# Patient Record
Sex: Male | Born: 1985 | Race: Black or African American | Hispanic: No | Marital: Single | Smoking: Current every day smoker
Health system: Southern US, Community
[De-identification: ages and names within clinical notes are randomized; demographics above are authoritative.]

---

## 2006-07-06 ENCOUNTER — Emergency Department (HOSPITAL_COMMUNITY): Admission: EM | Admit: 2006-07-06 | Discharge: 2006-07-06 | Payer: Self-pay | Admitting: Emergency Medicine

## 2006-07-17 ENCOUNTER — Emergency Department (HOSPITAL_COMMUNITY): Admission: EM | Admit: 2006-07-17 | Discharge: 2006-07-17 | Payer: Self-pay | Admitting: Emergency Medicine

## 2006-11-11 ENCOUNTER — Emergency Department (HOSPITAL_COMMUNITY): Admission: EM | Admit: 2006-11-11 | Discharge: 2006-11-11 | Payer: Self-pay | Admitting: Emergency Medicine

## 2006-12-05 ENCOUNTER — Emergency Department (HOSPITAL_COMMUNITY): Admission: EM | Admit: 2006-12-05 | Discharge: 2006-12-05 | Payer: Self-pay | Admitting: Emergency Medicine

## 2008-07-10 ENCOUNTER — Emergency Department (HOSPITAL_BASED_OUTPATIENT_CLINIC_OR_DEPARTMENT_OTHER): Admission: EM | Admit: 2008-07-10 | Discharge: 2008-07-10 | Payer: Self-pay | Admitting: Emergency Medicine

## 2009-05-27 ENCOUNTER — Ambulatory Visit: Payer: Self-pay | Admitting: Interventional Radiology

## 2009-05-27 ENCOUNTER — Emergency Department (HOSPITAL_BASED_OUTPATIENT_CLINIC_OR_DEPARTMENT_OTHER): Admission: EM | Admit: 2009-05-27 | Discharge: 2009-05-27 | Payer: Self-pay | Admitting: Emergency Medicine

## 2010-08-22 LAB — URINALYSIS, ROUTINE W REFLEX MICROSCOPIC
Bilirubin Urine: NEGATIVE
Glucose, UA: NEGATIVE mg/dL
Ketones, ur: 15 mg/dL — AB
Protein, ur: NEGATIVE mg/dL
Specific Gravity, Urine: 1.031 — ABNORMAL HIGH (ref 1.005–1.030)

## 2010-08-22 LAB — GC/CHLAMYDIA PROBE AMP, GENITAL: GC Probe Amp, Genital: POSITIVE — AB

## 2010-08-22 LAB — URINE MICROSCOPIC-ADD ON

## 2011-02-24 LAB — GC/CHLAMYDIA PROBE AMP, GENITAL: Chlamydia, DNA Probe: NEGATIVE

## 2011-02-25 LAB — I-STAT 8, (EC8 V) (CONVERTED LAB)
Acid-Base Excess: 1
Bicarbonate: 27.9 — ABNORMAL HIGH
Chloride: 107
Glucose, Bld: 83
Hemoglobin: 16.7
TCO2: 29
pCO2, Ven: 51.8 — ABNORMAL HIGH
pH, Ven: 7.339 — ABNORMAL HIGH

## 2011-02-25 LAB — DIFFERENTIAL
Eosinophils Relative: 6 — ABNORMAL HIGH
Monocytes Absolute: 0.5
Neutrophils Relative %: 52

## 2011-02-25 LAB — URINALYSIS, ROUTINE W REFLEX MICROSCOPIC
Bilirubin Urine: NEGATIVE
Glucose, UA: NEGATIVE
Leukocytes, UA: NEGATIVE
Nitrite: NEGATIVE
Protein, ur: NEGATIVE
Urobilinogen, UA: 1

## 2011-02-25 LAB — POCT I-STAT CREATININE: Creatinine, Ser: 1.1

## 2011-02-25 LAB — CBC
HCT: 46.1
Platelets: 267
RDW: 13.3

## 2011-02-25 LAB — URINE MICROSCOPIC-ADD ON

## 2011-05-21 ENCOUNTER — Encounter (HOSPITAL_BASED_OUTPATIENT_CLINIC_OR_DEPARTMENT_OTHER): Payer: Self-pay

## 2011-05-21 ENCOUNTER — Emergency Department (HOSPITAL_BASED_OUTPATIENT_CLINIC_OR_DEPARTMENT_OTHER)
Admission: EM | Admit: 2011-05-21 | Discharge: 2011-05-21 | Disposition: A | Discharge status: Home / Self Care | Payer: Self-pay | Attending: Emergency Medicine | Admitting: Emergency Medicine

## 2011-05-21 ENCOUNTER — Emergency Department (INDEPENDENT_AMBULATORY_CARE_PROVIDER_SITE_OTHER): Payer: Self-pay

## 2011-05-21 DIAGNOSIS — F17200 Nicotine dependence, unspecified, uncomplicated: Secondary | ICD-10-CM | POA: Insufficient documentation

## 2011-05-21 DIAGNOSIS — R05 Cough: Secondary | ICD-10-CM

## 2011-05-21 DIAGNOSIS — R059 Cough, unspecified: Secondary | ICD-10-CM | POA: Insufficient documentation

## 2011-05-21 DIAGNOSIS — M549 Dorsalgia, unspecified: Secondary | ICD-10-CM | POA: Insufficient documentation

## 2011-05-21 DIAGNOSIS — R0989 Other specified symptoms and signs involving the circulatory and respiratory systems: Secondary | ICD-10-CM

## 2011-05-21 MED ORDER — HYDROCOD POLST-CHLORPHEN POLST 10-8 MG/5ML PO LQCR: 5.0000 mL | Freq: Two times a day (BID) | ORAL | Status: DC | PRN

## 2011-05-21 NOTE — ED Provider Notes (Signed)
History     CSN: [id number]  Arrival date & time 05/21/11  1751   First MD Initiated Contact with Patient 05/21/11 1812      Chief Complaint  Patient presents with  . Back Pain  . Cough    (Consider location/radiation/quality/duration/timing/severity/associated sxs/prior treatment) HPI Comments: Pt states that his back hurts with coughing  Patient is a 26 y.o. male presenting with cough. The history is provided by the patient. No language interpreter was used.  Cough This is a new problem. The current episode started yesterday. The problem occurs constantly. The problem has not changed since onset.The cough is productive of sputum. There has been no fever. Associated symptoms include rhinorrhea. Pertinent negatives include no headaches and no shortness of breath. He has tried nothing for the symptoms. He is a smoker.    History reviewed. No pertinent past medical history.  History reviewed. No pertinent past surgical history.  No family history on file.  History  Substance Use Topics  . Smoking status: Current Everyday Smoker -- 0.5 packs/day  . Smokeless tobacco: Not on file  . Alcohol Use: 0.6 oz/week    1 Cans of beer per week     daily      Review of Systems  HENT: Positive for rhinorrhea.   Respiratory: Positive for cough. Negative for shortness of breath.   Neurological: Negative for headaches.  All other systems reviewed and are negative.    Allergies  Review of patient's allergies indicates no known allergies.  Home Medications   Current Outpatient Rx  Name Route Sig Dispense Refill  . DM-DOXYLAMINE-ACETAMINOPHEN 15-6.25-325 MG PO CAPS Oral Take 2 capsules by mouth once.    Marland Kitchen PHENYLEPHRINE-APAP-GUAIFENESIN 10-650-400 MG/20ML PO LIQD Oral Take 45 mLs by mouth once.    Marland Kitchen HYDROCOD POLST-CPM POLST ER 10-8 MG/5ML PO LQCR Oral Take 5 mLs by mouth every 12 (twelve) hours as needed. 140 mL 0    BP 104/55  Pulse 98  Temp(Src) 98.7 F (37.1 C) (Oral)  Resp  16  Ht 5\' 10"  (1.778 m)  Wt 145 lb (65.772 kg)  BMI 20.81 kg/m2  SpO2 100%  Physical Exam  Nursing note and vitals reviewed. Constitutional: He is oriented to person, place, and time. He appears well-developed and well-nourished.  HENT:  Head: Normocephalic and atraumatic.  Right Ear: External ear normal.  Left Ear: External ear normal.  Nose: Rhinorrhea present.  Mouth/Throat: Posterior oropharyngeal erythema present.  Eyes: Conjunctivae and EOM are normal.  Neck: Normal range of motion. Neck supple.  Cardiovascular: Normal rate and regular rhythm.   Pulmonary/Chest: Effort normal and breath sounds normal.  Musculoskeletal: Normal range of motion. He exhibits no tenderness.  Neurological: He is alert and oriented to person, place, and time. He exhibits normal muscle tone. Coordination normal.  Skin: Skin is warm and dry.    ED Course  Procedures (including critical care time)  Labs Reviewed - No data to display Dg Chest 2 View  05/21/2011  *RADIOLOGY REPORT*  Clinical Data: Cough and congestion.  CHEST - 2 VIEW  Comparison: None.  Findings: Lungs are hyperexpanded. The lungs are clear without focal infiltrate, edema, pneumothorax or pleural effusion. The cardiopericardial silhouette is within normal limits for size. Imaged bony structures of the thorax are intact.  IMPRESSION: Hyperexpansion without acute cardiopulmonary process.  Original Report Authenticated By: ERIC A. MANSELL, M.D.     1. Cough       MDM  No acute finding on x-ray:will treat symptomatically  Teressa Lower, NP 05/21/11 1839

## 2011-05-21 NOTE — ED Provider Notes (Signed)
Medical screening examination/treatment/procedure(s) were performed by non-physician practitioner and as supervising physician I was immediately available for consultation/collaboration.   Forbes Cellar, MD 05/21/11 1930

## 2011-05-21 NOTE — ED Notes (Signed)
Pt reports cough and back pain that started yesterday.

## 2011-08-27 ENCOUNTER — Encounter (HOSPITAL_BASED_OUTPATIENT_CLINIC_OR_DEPARTMENT_OTHER): Payer: Self-pay | Admitting: *Deleted

## 2011-08-27 ENCOUNTER — Emergency Department (HOSPITAL_BASED_OUTPATIENT_CLINIC_OR_DEPARTMENT_OTHER)
Admission: EM | Admit: 2011-08-27 | Discharge: 2011-08-27 | Disposition: A | Discharge status: Home / Self Care | Payer: Self-pay | Attending: Emergency Medicine | Admitting: Emergency Medicine

## 2011-08-27 DIAGNOSIS — R109 Unspecified abdominal pain: Secondary | ICD-10-CM | POA: Insufficient documentation

## 2011-08-27 DIAGNOSIS — N342 Other urethritis: Secondary | ICD-10-CM | POA: Insufficient documentation

## 2011-08-27 LAB — URINE MICROSCOPIC-ADD ON

## 2011-08-27 LAB — URINALYSIS, ROUTINE W REFLEX MICROSCOPIC
Bilirubin Urine: NEGATIVE
Protein, ur: NEGATIVE mg/dL
Urobilinogen, UA: 0.2 mg/dL (ref 0.0–1.0)

## 2011-08-27 MED ORDER — AZITHROMYCIN 250 MG PO TABS
1000.0000 mg | ORAL_TABLET | Freq: Once | ORAL | Status: AC
  Administered 2011-08-27: 1000 mg via ORAL
  Filled 2011-08-27: qty 4

## 2011-08-27 MED ORDER — CEFTRIAXONE SODIUM 250 MG IJ SOLR
250.0000 mg | Freq: Once | INTRAMUSCULAR | Status: AC
  Administered 2011-08-27: 250 mg via INTRAMUSCULAR
  Filled 2011-08-27: qty 250

## 2011-08-27 MED ORDER — METRONIDAZOLE 500 MG PO TABS
2000.0000 mg | ORAL_TABLET | Freq: Once | ORAL | Status: AC
  Administered 2011-08-27: 2000 mg via ORAL
  Filled 2011-08-27: qty 4

## 2011-08-27 NOTE — ED Notes (Signed)
Patient states his girl friend was here last week and dx with chlamydia/gonorrhea so he came to get checked out.

## 2011-08-27 NOTE — Discharge Instructions (Signed)
You have been treated today for possible sexually transmitted infection to include gonorrhea, Chlamydia and Trichomonas.  I do recommend you go to the health department for HIV testing.  You should not have any sexual contact for the next week.  Return for further symptoms.  Urethritis, Adult Urethritis is an inflammation (soreness) of the urethra (the tube exiting from the bladder). It is often caused by germs that may be spread through sexual contact. TREATMENT  Urethritis will usually respond to antibiotics. These are medications that kill germs. Take all the medicine given to you. You may feel better in a couple days, but TAKE ALL MEDICINE or the infection may not be completely cured and may become more difficult to treat. Response can generally be expected in 7 to 10 days. You may require additional treatment after more testing. HOME CARE INSTRUCTIONS  Not have sex until the test results are known and treatment is completed.   Know that you may be asked to notify your sex partner when your final test results are back.   Finish all medications as prescribed.   Prevent sexually transmitted infections including AIDS. Practice safe sex. Use condoms.  SEEK MEDICAL CARE IF:   Your symptoms are not improved in 2 to 3 days.   Your symptoms are getting worse.   Your develop abdominal pain.   You develop joint pain.  SEEK IMMEDIATE MEDICAL CARE IF:   You have a fever.   You develop severe pain in the belly, back or side.   You develop repeated vomiting.  TEST RESULTS Not all test results are available during your visit. If your test results are not back during the visit, make an appointment with your caregiver to find out the results. Do not assume everything is normal if you have not heard from your caregiver or the medical facility. It is important for you to follow-up on all of your test results. Document Released: 10/22/2000 Document Revised: 04/17/2011 Document Reviewed:  05/14/2009 [facility] Patient Information 2012 [place], [place].

## 2011-08-27 NOTE — ED Notes (Signed)
Pt c/o lower abd pain x 2 days denies n/v/d

## 2011-08-27 NOTE — ED Provider Notes (Signed)
History     CSN: [id number]  Arrival date & time 08/27/11  1545   First MD Initiated Contact with Patient 08/27/11 1737      Chief Complaint  Patient presents with  . Abdominal Pain    (Consider location/radiation/quality/duration/timing/severity/associated sxs/prior treatment) HPI Comments: Patient presents with left lower abdominal pain for 2 days.  He notes no fevers, chills, vomiting or diarrhea.  No nausea.  No dysuria or hematuria.  Patient notes that he does have a recent sexual partner who was diagnosed with either gonorrhea or Chlamydia.  She advised him of this and recommended he seek treatment.  Patient denies urethral discharge.  He denies any dysuria.  Patient is a 26 y.o. male presenting with abdominal pain. The history is provided by the patient. No language interpreter was used.  Abdominal Pain The primary symptoms of the illness include abdominal pain. The primary symptoms of the illness do not include fever, fatigue, shortness of breath, nausea, vomiting, diarrhea, hematemesis, hematochezia or dysuria. The current episode started 2 days ago. The onset of the illness was gradual. The problem has not changed since onset. Symptoms associated with the illness do not include chills, hematuria or back pain.    History reviewed. No pertinent past medical history.  History reviewed. No pertinent past surgical history.  History reviewed. No pertinent family history.  History  Substance Use Topics  . Smoking status: Current Everyday Smoker -- 0.5 packs/day  . Smokeless tobacco: Not on file  . Alcohol Use: 0.6 oz/week    1 Cans of beer per week     daily      Review of Systems  Constitutional: Negative.  Negative for fever, chills and fatigue.  HENT: Negative.   Eyes: Negative.  Negative for discharge and redness.  Respiratory: Negative.  Negative for cough and shortness of breath.   Cardiovascular: Negative.  Negative for chest pain.  Gastrointestinal: Positive  for abdominal pain. Negative for nausea, vomiting, diarrhea, hematochezia and hematemesis.  Genitourinary: Negative.  Negative for dysuria and hematuria.  Musculoskeletal: Negative.  Negative for back pain.  Skin: Negative.  Negative for color change and rash.  Neurological: Negative for syncope and headaches.  Hematological: Negative.  Negative for adenopathy.  Psychiatric/Behavioral: Negative.  Negative for confusion.  All other systems reviewed and are negative.    Allergies  Review of patient's allergies indicates no known allergies.  Home Medications  No current outpatient prescriptions on file.  BP 118/73  Pulse 97  Temp(Src) 99.1 F (37.3 C) (Oral)  Resp 18  Ht 6' (1.829 m)  Wt 145 lb (65.772 kg)  BMI 19.67 kg/m2  SpO2 100%  Physical Exam  Nursing note and vitals reviewed. Constitutional: He is oriented to person, place, and time. He appears well-developed and well-nourished.  Non-toxic appearance. He does not have a sickly appearance.  HENT:  Head: Normocephalic and atraumatic.  Eyes: Conjunctivae, EOM and lids are normal. Pupils are equal, round, and reactive to light.  Neck: Trachea normal, normal range of motion and full passive range of motion without pain. Neck supple.  Cardiovascular: Normal rate, regular rhythm and normal heart sounds.   Pulmonary/Chest: Effort normal and breath sounds normal. No respiratory distress.  Abdominal: Soft. Normal appearance. He exhibits no distension. There is no tenderness. There is no rebound and no CVA tenderness.  Musculoskeletal: Normal range of motion.  Neurological: He is alert and oriented to person, place, and time. He has normal strength.  Skin: Skin is warm, dry and intact.  No rash noted.  Psychiatric: He has a normal mood and affect. His behavior is normal. Judgment and thought content normal.    ED Course  Procedures (including critical care time)  Results for orders placed during the hospital encounter of  08/27/11  URINALYSIS, ROUTINE W REFLEX MICROSCOPIC      Component Value Range   Color, Urine YELLOW  YELLOW    APPearance CLOUDY (*) CLEAR    Specific Gravity, Urine 1.023  1.005 - 1.030    pH 6.0  5.0 - 8.0    Glucose, UA NEGATIVE  NEGATIVE (mg/dL)   Hgb urine dipstick SMALL (*) NEGATIVE    Bilirubin Urine NEGATIVE  NEGATIVE    Ketones, ur NEGATIVE  NEGATIVE (mg/dL)   Protein, ur NEGATIVE  NEGATIVE (mg/dL)   Urobilinogen, UA 0.2  0.0 - 1.0 (mg/dL)   Nitrite NEGATIVE  NEGATIVE    Leukocytes, UA MODERATE (*) NEGATIVE   URINE MICROSCOPIC-ADD ON      Component Value Range   Squamous Epithelial / LPF RARE  RARE    WBC, UA 21-50  <3 (WBC/hpf)   RBC / HPF 3-6  <3 (RBC/hpf)   Bacteria, UA FEW (*) RARE    Urine-Other MUCOUS PRESENT         MDM  Patient with findings concerning for possible asymptomatic urethritis.  Patient's abdominal exam is benign and has no GI symptoms or fevers to suggest that he has acute colitis at this time.  Patient does have known sexually transmitted infection exposure so I will treat him with ceftriaxone, azithromycin and Flagyl.  I've advised him to followup with HIV testing at the health department.  I've also advised him to refrain from any further sexual contact for the next week.  I've also advised him that condom use is essential and protecting his health.        Nat Christen, MD 08/27/11 [phone]

## 2012-01-03 ENCOUNTER — Emergency Department (HOSPITAL_BASED_OUTPATIENT_CLINIC_OR_DEPARTMENT_OTHER)
Admission: EM | Admit: 2012-01-03 | Discharge: 2012-01-03 | Disposition: A | Discharge status: Home / Self Care | Payer: Self-pay | Attending: Emergency Medicine | Admitting: Emergency Medicine

## 2012-01-03 ENCOUNTER — Encounter (HOSPITAL_BASED_OUTPATIENT_CLINIC_OR_DEPARTMENT_OTHER): Payer: Self-pay | Admitting: *Deleted

## 2012-01-03 DIAGNOSIS — E860 Dehydration: Secondary | ICD-10-CM | POA: Insufficient documentation

## 2012-01-03 DIAGNOSIS — F17200 Nicotine dependence, unspecified, uncomplicated: Secondary | ICD-10-CM | POA: Insufficient documentation

## 2012-01-03 DIAGNOSIS — E162 Hypoglycemia, unspecified: Secondary | ICD-10-CM | POA: Insufficient documentation

## 2012-01-03 LAB — CBC WITH DIFFERENTIAL/PLATELET
Basophils Absolute: 0 10*3/uL (ref 0.0–0.1)
Lymphocytes Relative: 7 % — ABNORMAL LOW (ref 12–46)
Lymphs Abs: 0.8 10*3/uL (ref 0.7–4.0)
MCH: 32 pg (ref 26.0–34.0)
MCHC: 34.7 g/dL (ref 30.0–36.0)
MCV: 92.3 fL (ref 78.0–100.0)
Monocytes Absolute: 0.4 10*3/uL (ref 0.1–1.0)
Neutrophils Relative %: 89 % — ABNORMAL HIGH (ref 43–77)
RBC: 4.78 MIL/uL (ref 4.22–5.81)
WBC: 10.5 10*3/uL (ref 4.0–10.5)

## 2012-01-03 LAB — COMPREHENSIVE METABOLIC PANEL
AST: 41 U/L — ABNORMAL HIGH (ref 0–37)
Alkaline Phosphatase: 75 U/L (ref 39–117)
CO2: 17 mEq/L — ABNORMAL LOW (ref 19–32)
Chloride: 94 mEq/L — ABNORMAL LOW (ref 96–112)
Creatinine, Ser: 1 mg/dL (ref 0.50–1.35)
GFR calc Af Amer: >90 mL/min (ref >90)
GFR calc non Af Amer: >90 mL/min (ref >90)
Potassium: 4.4 mEq/L (ref 3.5–5.1)
Total Bilirubin: 0.3 mg/dL (ref 0.3–1.2)
Total Protein: 8.5 g/dL — ABNORMAL HIGH (ref 6.0–8.3)

## 2012-01-03 LAB — URINALYSIS, ROUTINE W REFLEX MICROSCOPIC
Bilirubin Urine: NEGATIVE
Ketones, ur: >80 mg/dL — AB
Leukocytes, UA: NEGATIVE
Specific Gravity, Urine: 1.021 (ref 1.005–1.030)

## 2012-01-03 LAB — URINE MICROSCOPIC-ADD ON

## 2012-01-03 MED ORDER — SODIUM CHLORIDE 0.9 % IV BOLUS (SEPSIS)
1000.0000 mL | Freq: Once | INTRAVENOUS | Status: AC
  Administered 2012-01-03: 1000 mL via INTRAVENOUS

## 2012-01-03 NOTE — ED Notes (Signed)
Pt states he feels like he is dehydrated. Fine until this a.m.

## 2012-01-03 NOTE — ED Notes (Signed)
Pt reports weakness this morning when he awoke.  Pt reports no pain. Pt states "I feel like I'm dehydrated." pt reports no problems drinking or eating.  NAD noted at this time.

## 2012-01-03 NOTE — ED Provider Notes (Addendum)
History     CSN: [id number]  Arrival date & time 01/03/12  1301   First MD Initiated Contact with Patient 01/03/12 1339      Chief Complaint  Patient presents with  . Fatigue    (Consider location/radiation/quality/duration/timing/severity/associated sxs/prior treatment) HPI Comments: Pt c/o of not feeling well since he woke up this am with chills.  Denies N/V/D, abdominal pain, GU sx.  No URI sx such as cough, sorethroat, congestion.  States he feels dehydrated and has not eaten today.  No prior hx of similar.  Was his normal self yesterday.  No sick contacts.  The history is provided by the patient.    History reviewed. No pertinent past medical history.  History reviewed. No pertinent past surgical history.  History reviewed. No pertinent family history.  History  Substance Use Topics  . Smoking status: Current Everyday Smoker -- 0.5 packs/day  . Smokeless tobacco: Not on file  . Alcohol Use: 0.6 oz/week    1 Cans of beer per week     daily      Review of Systems  Constitutional: Positive for chills and appetite change. Negative for fever and unexpected weight change.  HENT: Negative for congestion, sore throat, rhinorrhea and sinus pressure.   Respiratory: Negative for cough and shortness of breath.   Cardiovascular: Negative for chest pain and leg swelling.  Gastrointestinal: Negative for nausea, vomiting, abdominal pain and diarrhea.  Genitourinary: Negative for dysuria.  All other systems reviewed and are negative.    Allergies  Review of patient's allergies indicates no known allergies.  Home Medications  No current outpatient prescriptions on file.  BP 119/70  Pulse 91  Temp 97.5 F (36.4 C) (Oral)  Resp 14  Ht 5\' 10"  (1.778 m)  Wt 135 lb (61.236 kg)  BMI 19.37 kg/m2  SpO2 100%  Physical Exam  Nursing note and vitals reviewed. Constitutional: He is oriented to person, place, and time. He appears well-developed and well-nourished. No distress.   HENT:  Head: Normocephalic and atraumatic.  Mouth/Throat: Oropharynx is clear and moist.  Eyes: Conjunctivae and EOM are normal. Pupils are equal, round, and reactive to light.  Neck: Normal range of motion. Neck supple.  Cardiovascular: Normal rate, regular rhythm and intact distal pulses.   No murmur heard. Pulmonary/Chest: Effort normal and breath sounds normal. No respiratory distress. He has no wheezes. He has no rales.  Abdominal: Soft. He exhibits no distension. There is no tenderness. There is no rebound and no guarding.  Musculoskeletal: Normal range of motion. He exhibits no edema and no tenderness.  Lymphadenopathy:    He has no cervical adenopathy.  Neurological: He is alert and oriented to person, place, and time. He has normal strength. No sensory deficit.  Skin: Skin is warm and dry. No rash noted. No erythema.  Psychiatric: He has a normal mood and affect. His behavior is normal.    ED Course  Procedures (including critical care time)  Labs Reviewed  URINALYSIS, ROUTINE W REFLEX MICROSCOPIC - Abnormal; Notable for the following:    Hgb urine dipstick MODERATE (*)     Ketones, ur >80 (*)     All other components within normal limits  CBC WITH DIFFERENTIAL - Abnormal; Notable for the following:    Neutrophils Relative 89 (*)     Neutro Abs 9.3 (*)     Lymphocytes Relative 7 (*)     All other components within normal limits  COMPREHENSIVE METABOLIC PANEL - Abnormal; Notable for the  following:    Sodium 134 (*)     Chloride 94 (*)     CO2 17 (*)     Glucose, Bld 56 (*)     Total Protein 8.5 (*)     AST 41 (*)     All other components within normal limits  URINE MICROSCOPIC-ADD ON   No results found.   1. Dehydration   2. Hypoglycemia       MDM   Patient with vague symptoms of not feeling well today and chills. He denies any pain, URI symptoms, abdominal symptoms, GU symptoms. Patient has no lymphadenopathy and normal vital signs. He states he was fine  yesterday and did not feel well this morning. He said he has not drank or eaten anything today but was drinking and eating normally yesterday. Patient has no medical problems and takes no medications regularly. He denies any recent weight gain or loss. Unclear the source of patient's symptoms today. Maybe early viral syndrome. Patient given IV fluid bolus. CBC, CMP, UA pending.  3:13 PM Labs indicated dehyration with >80 ketones in the urine and hypoglycemia with BS of 56.  (Pt has not eaten today).  Pt given po's and after fluids feeling better and d/ced home.       Gwyneth Sprout, MD 01/03/12 1514  Gwyneth Sprout, MD 01/03/12 [phone]

## 2015-12-11 ENCOUNTER — Encounter (HOSPITAL_BASED_OUTPATIENT_CLINIC_OR_DEPARTMENT_OTHER): Payer: Self-pay | Admitting: *Deleted

## 2015-12-11 ENCOUNTER — Emergency Department (HOSPITAL_BASED_OUTPATIENT_CLINIC_OR_DEPARTMENT_OTHER)
Admission: EM | Admit: 2015-12-11 | Discharge: 2015-12-11 | Disposition: A | Discharge status: Home / Self Care | Payer: Self-pay | Attending: Emergency Medicine | Admitting: Emergency Medicine

## 2015-12-11 DIAGNOSIS — Y999 Unspecified external cause status: Secondary | ICD-10-CM | POA: Insufficient documentation

## 2015-12-11 DIAGNOSIS — S81811A Laceration without foreign body, right lower leg, initial encounter: Secondary | ICD-10-CM | POA: Insufficient documentation

## 2015-12-11 DIAGNOSIS — L089 Local infection of the skin and subcutaneous tissue, unspecified: Secondary | ICD-10-CM

## 2015-12-11 DIAGNOSIS — Y939 Activity, unspecified: Secondary | ICD-10-CM | POA: Insufficient documentation

## 2015-12-11 DIAGNOSIS — Y829 Unspecified medical devices associated with adverse incidents: Secondary | ICD-10-CM | POA: Insufficient documentation

## 2015-12-11 DIAGNOSIS — W269XXA Contact with unspecified sharp object(s), initial encounter: Secondary | ICD-10-CM | POA: Insufficient documentation

## 2015-12-11 DIAGNOSIS — F17200 Nicotine dependence, unspecified, uncomplicated: Secondary | ICD-10-CM | POA: Insufficient documentation

## 2015-12-11 DIAGNOSIS — Y929 Unspecified place or not applicable: Secondary | ICD-10-CM | POA: Insufficient documentation

## 2015-12-11 MED ORDER — CEPHALEXIN 500 MG PO CAPS: ORAL_CAPSULE | ORAL | 0 refills | Status: DC

## 2015-12-11 MED ORDER — TETANUS-DIPHTH-ACELL PERTUSSIS 5-2.5-18.5 LF-MCG/0.5 IM SUSP
0.5000 mL | Freq: Once | INTRAMUSCULAR | Status: AC
  Administered 2015-12-11: 0.5 mL via INTRAMUSCULAR
  Filled 2015-12-11: qty 0.5

## 2015-12-11 NOTE — ED Provider Notes (Signed)
[facility] Provider Note   CSN: [id number] Arrival date & time: 12/11/15  1711  First Provider Contact:  First MD Initiated Contact with Patient 12/11/15 1737     By signing my name below, I, Lee Glover, attest that this documentation has been prepared under the direction and in the presence of Fayrene Helper, PA-C.  Electronically Signed: Rosario Glover, ED Scribe. 12/11/15. 5:44 PM.  History   Chief Complaint Chief Complaint  Patient presents with  . Recurrent Skin Infections   The history is provided by the patient. No language interpreter was used.   HPI Comments: Lee Glover is a 30 y.o. male with no pertinent PMHx, who presents to the Emergency Department complaining of gradual onset, gradually worsening, moderate wound infection to his right, lower leg s/p laceration that occurred ~2 weeks PTA. His laceration was sustained when an unknown object struck his leg while mowing his lawn. Pt reports pain to the area with palpation, but no pain otherwise. He has been cleaning the area with water and Hydrogen Peroxide, and applying neosporin creme with minimal relief of his symptoms. No other noted or sustained injuries. No hx of DM. Tetanus is not UTD. Denies any other associated symptoms.   History reviewed. No pertinent past medical history.  There are no active problems to display for this patient.  History reviewed. No pertinent surgical history.  Home Medications    Prior to Admission medications   Not on File   Family History No family history on file.  Social History Social History  Substance Use Topics  . Smoking status: Current Every Day Smoker    Packs/day: 0.50  . Smokeless tobacco: Never Used  . Alcohol use 0.6 oz/week    1 Cans of beer per week     Comment: daily   Allergies   Review of patient's allergies indicates no known allergies.  Review of Systems Review of Systems  Constitutional: Negative for chills and fever.    Skin: Positive for wound.  All other systems reviewed and are negative.   Physical Exam Updated Vital Signs BP 125/89 (BP Location: Left Arm)   Pulse 88   Temp 97.9 F (36.6 C) (Oral)   Resp 18   Ht 5\' 9"  (1.753 m)   Wt 109 lb (49.4 kg)   SpO2 100%   BMI 16.10 kg/m   Physical Exam  Constitutional: He appears well-developed and well-nourished.  HENT:  Head: Normocephalic.  Eyes: Conjunctivae are normal.  Cardiovascular: Normal rate and intact distal pulses.   Pulmonary/Chest: Effort normal. No respiratory distress.  Abdominal: He exhibits no distension.  Musculoskeletal: Normal range of motion.  Neurological: He is alert.  Skin: Skin is warm and dry. Capillary refill takes less than 2 seconds. No erythema.  2cm ulceration sustained right anterior shin w/ mild serosanguinous discharge noted. W/o signs skin erythema. No significant pustule discharge. No evidence of abscess. Mildly TTP.   Psychiatric: He has a normal mood and affect. His behavior is normal.  Nursing note and vitals reviewed.  ED Treatments / Results  DIAGNOSTIC STUDIES: Oxygen Saturation is 100% on RA, normal by my interpretation.   COORDINATION OF CARE: 5:40 PM-Discussed next steps with pt including a course of abx. Pt verbalized understanding and is agreeable with the plan.   Labs (all labs ordered are listed, but only abnormal results are displayed) Labs Reviewed - No data to display  EKG  EKG Interpretation None       Radiology No results  found.  Procedures Procedures (including critical care time)  Medications Ordered in ED Medications - No data to display   Initial Impression / Assessment and Plan / ED Course  I have reviewed the triage vital signs and the nursing notes.  Pertinent labs & imaging results that were available during my care of the patient were reviewed by me and considered in my medical decision making (see chart for details).  Clinical Course    BP 125/89 (BP  Location: Left Arm)   Pulse 88   Temp 97.9 F (36.6 C) (Oral)   Resp 18   Ht  (1.753 m)   Wt 49.4 kg   SpO2 100%   BMI 16.10 kg/m   Patient presents with a nonhealing right lower leg laceration. He does not appears to have me obvious abscess or retained foreign object. Pt prescribed course of abx. Pt also given TDAP booster while in the ED. Advised to f/u w/ PCP for new or worsening symptoms. Pt agreeable w/ plan.  Final Clinical Impressions(s) / ED Diagnoses   Final diagnoses:  Laceration of lower leg with infection, right, initial encounter    New Prescriptions New Prescriptions   CEPHALEXIN (KEFLEX) 500 MG CAPSULE    2 caps po bid x 7 days   I personally performed the services described in this documentation, which was scribed in my presence. The recorded information has been reviewed and is accurate.       Fayrene Helper, PA-C 12/11/15 1747    Melene Plan, DO 12/11/15 2043

## 2015-12-11 NOTE — ED Triage Notes (Signed)
Laceration to his right lower leg 2 weeks ago. Unknown object flew from under the lawn mower while mowing. Wound looks infected.

## 2019-08-26 ENCOUNTER — Emergency Department (HOSPITAL_BASED_OUTPATIENT_CLINIC_OR_DEPARTMENT_OTHER)
Admission: EM | Admit: 2019-08-26 | Discharge: 2019-08-27 | Disposition: A | Discharge status: Home / Self Care | Payer: No Typology Code available for payment source | Attending: Emergency Medicine | Admitting: Emergency Medicine

## 2019-08-26 ENCOUNTER — Encounter (HOSPITAL_BASED_OUTPATIENT_CLINIC_OR_DEPARTMENT_OTHER): Payer: Self-pay | Admitting: *Deleted

## 2019-08-26 ENCOUNTER — Other Ambulatory Visit: Payer: Self-pay

## 2019-08-26 ENCOUNTER — Emergency Department (HOSPITAL_BASED_OUTPATIENT_CLINIC_OR_DEPARTMENT_OTHER): Payer: No Typology Code available for payment source

## 2019-08-26 DIAGNOSIS — S300XXA Contusion of lower back and pelvis, initial encounter: Secondary | ICD-10-CM | POA: Insufficient documentation

## 2019-08-26 DIAGNOSIS — Y999 Unspecified external cause status: Secondary | ICD-10-CM | POA: Insufficient documentation

## 2019-08-26 DIAGNOSIS — Y9289 Other specified places as the place of occurrence of the external cause: Secondary | ICD-10-CM | POA: Insufficient documentation

## 2019-08-26 DIAGNOSIS — F17210 Nicotine dependence, cigarettes, uncomplicated: Secondary | ICD-10-CM | POA: Diagnosis not present

## 2019-08-26 DIAGNOSIS — Y9389 Activity, other specified: Secondary | ICD-10-CM | POA: Insufficient documentation

## 2019-08-26 DIAGNOSIS — S3992XA Unspecified injury of lower back, initial encounter: Secondary | ICD-10-CM | POA: Diagnosis present

## 2019-08-26 LAB — URINALYSIS, ROUTINE W REFLEX MICROSCOPIC
Bilirubin Urine: NEGATIVE
Glucose, UA: NEGATIVE mg/dL
Ketones, ur: NEGATIVE mg/dL
Leukocytes,Ua: NEGATIVE
Nitrite: NEGATIVE
Protein, ur: NEGATIVE mg/dL
Specific Gravity, Urine: >1.03 — ABNORMAL HIGH (ref 1.005–1.030)
pH: 6 (ref 5.0–8.0)

## 2019-08-26 LAB — URINALYSIS, MICROSCOPIC (REFLEX): WBC, UA: NONE SEEN WBC/hpf (ref 0–5)

## 2019-08-26 MED ORDER — IBUPROFEN 400 MG PO TABS
400.0000 mg | ORAL_TABLET | Freq: Once | ORAL | Status: AC
  Administered 2019-08-27: 400 mg via ORAL
  Filled 2019-08-26: qty 1
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
## 2019-08-27 NOTE — Discharge Instructions (Signed)
Apply ice for 30 minutes at a time, 4 times a day.  Take ibuprofen and/or acetaminophen as needed for pain. 
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
